# Patient Record
Sex: Male | Born: 1975 | Race: Black or African American | Hispanic: No | Marital: Single | Smoking: Current every day smoker
Health system: Southern US, Community
[De-identification: ages and names within clinical notes are randomized; demographics above are authoritative.]

---

## 2020-07-21 ENCOUNTER — Ambulatory Visit
Admission: EM | Admit: 2020-07-21 | Discharge: 2020-07-21 | Disposition: A | Discharge status: Home / Self Care | Payer: BC Managed Care – PPO | Attending: Internal Medicine | Admitting: Internal Medicine

## 2020-07-21 ENCOUNTER — Ambulatory Visit (INDEPENDENT_AMBULATORY_CARE_PROVIDER_SITE_OTHER): Payer: BC Managed Care – PPO

## 2020-07-21 DIAGNOSIS — M50322 Other cervical disc degeneration at C5-C6 level: Secondary | ICD-10-CM | POA: Diagnosis not present

## 2020-07-21 DIAGNOSIS — M79601 Pain in right arm: Secondary | ICD-10-CM

## 2020-07-21 DIAGNOSIS — B349 Viral infection, unspecified: Secondary | ICD-10-CM

## 2020-07-21 DIAGNOSIS — M542 Cervicalgia: Secondary | ICD-10-CM

## 2020-07-21 DIAGNOSIS — M25511 Pain in right shoulder: Secondary | ICD-10-CM

## 2020-07-21 DIAGNOSIS — Z1152 Encounter for screening for COVID-19: Secondary | ICD-10-CM

## 2020-07-21 MED ORDER — ONDANSETRON 4 MG PO TBDP: 4.0000 mg | ORAL_TABLET | Freq: Three times a day (TID) | ORAL | 0 refills | Status: AC | PRN

## 2020-07-21 MED ORDER — IBUPROFEN 600 MG PO TABS: 600.0000 mg | ORAL_TABLET | Freq: Four times a day (QID) | ORAL | 0 refills | Status: AC | PRN

## 2020-07-21 NOTE — ED Triage Notes (Signed)
Pt presents with c/o headache , body aches's and right shoulder pain since Sunday, pt also reports cold symptoms

## 2020-07-21 NOTE — Discharge Instructions (Signed)
Increase oral fluid intake Gentle range of motion exercises of the neck and right upper extremity If right upper extremity symptoms worsen you may benefit from further imaging of the neck. If you experience any weakness in the right arm, please go to the emergency room for imaging of the neck. Please quarantine until COVID-19 test results are available

## 2020-07-21 NOTE — ED Provider Notes (Signed)
[facility]    CSN: [id number] Arrival date & time: 07/21/20  1145      History   Chief Complaint Chief Complaint  Patient presents with  . Headache  . Shoulder Pain    HPI Gene Glazebrook is a 44 y.o. male comes to the urgent care with complaints of general malaise, nonproductive cough, nausea and vomiting as well as some diarrheal bowel movements over the past 4 to 5 days.  Patient says symptoms started insidiously and has been persistent.  He denies any fever or chills.  Appetite is diminished.  Patient also complains of right-sided arm pain with some right-sided neck pain.  No trauma to the neck.  He has some nasal congestion without any ear symptoms.  No sick contacts. HPI  History reviewed. No pertinent past medical history.  There are no problems to display for this patient.   History reviewed. No pertinent surgical history.     Home Medications    Prior to Admission medications   Medication Sig Start Date End Date Taking? Authorizing Provider  ibuprofen (ADVIL) 600 MG tablet Take 1 tablet (600 mg total) by mouth every 6 (six) hours as needed. 07/21/20   Michaeljoseph Revolorio, Britta Mccreedy, MD  ondansetron (ZOFRAN ODT) 4 MG disintegrating tablet Take 1 tablet (4 mg total) by mouth every 8 (eight) hours as needed for nausea or vomiting. 07/21/20   Rosibel Giacobbe, Britta Mccreedy, MD    Family History History reviewed. No pertinent family history.  Social History Social History   Tobacco Use  . Smoking status: Current Every Day Smoker    Packs/day: 0.50  . Smokeless tobacco: Never Used  Substance Use Topics  . Alcohol use: Not Currently  . Drug use: Not Currently     Allergies   Patient has no known allergies.   Review of Systems Review of Systems  HENT: Negative.   Respiratory: Negative.   Gastrointestinal: Negative.   Genitourinary: Negative.   Musculoskeletal: Positive for myalgias and neck pain. Negative for arthralgias and gait problem.  Skin: Negative.     Neurological: Negative for dizziness, light-headedness and numbness.     Physical Exam Triage Vital Signs ED Triage Vitals  Enc Vitals Group     BP 07/21/20 1302 (!) 134/93     Pulse Rate 07/21/20 1302 74     Resp 07/21/20 1302 20     Temp 07/21/20 1302 98.3 F (36.8 C)     Temp src --      SpO2 07/21/20 1302 95 %     Weight --      Height --      Head Circumference --      Peak Flow --      Pain Score 07/21/20 1258 6     Pain Loc --      Pain Edu? --      Excl. in GC? --    No data found.  Updated Vital Signs BP (!) 134/93   Pulse 74   Temp 98.3 F (36.8 C)   Resp 20   SpO2 95%   Visual Acuity Right Eye Distance:   Left Eye Distance:   Bilateral Distance:    Right Eye Near:   Left Eye Near:    Bilateral Near:     Physical Exam Vitals and nursing note reviewed.  Constitutional:      General: He is not in acute distress.    Appearance: He is not ill-appearing.  HENT:     Head: Normocephalic.  Mouth/Throat:     Mouth: Mucous membranes are moist.  Cardiovascular:     Rate and Rhythm: Normal rate and regular rhythm.  Pulmonary:     Effort: Pulmonary effort is normal.     Breath sounds: Normal breath sounds.  Abdominal:     General: Bowel sounds are normal.     Palpations: Abdomen is soft.  Musculoskeletal:     Cervical back: Normal range of motion. No rigidity.  Lymphadenopathy:     Cervical: No cervical adenopathy.  Neurological:     Mental Status: He is alert.     GCS: GCS eye subscore is 4. GCS verbal subscore is 5. GCS motor subscore is 6.     Cranial Nerves: No cranial nerve deficit or dysarthria.     Sensory: No sensory deficit.     Motor: No weakness.     Deep Tendon Reflexes: Reflexes abnormal.      UC Treatments / Results  Labs (all labs ordered are listed, but only abnormal results are displayed) Labs Reviewed  COVID-19, FLU A+B AND RSV  CBC  BASIC METABOLIC PANEL    EKG   Radiology No results  found.  Procedures Procedures (including critical care time)  Medications Ordered in UC Medications - No data to display  Initial Impression / Assessment and Plan / UC Course  I have reviewed the triage vital signs and the nursing notes.  Pertinent labs & imaging results that were available during my care of the patient were reviewed by me and considered in my medical decision making (see chart for details).     1.  Acute viral illness: Covid/flu/RSV PCR test sent CBC, BMP Ibuprofen as needed for pain and/or fever Return precautions given  2.  Neck pain with right arm pain: Cervical spine x-ray is significant for moderate multilevel degenerative disc disease with mild bilateral neural foraminal opening If neck symptoms persist patient will need to see a spine surgeon for further imaging and definitive management. Final Clinical Impressions(s) / UC Diagnoses   Final diagnoses:  Viral syndrome  Right arm pain     Discharge Instructions     Increase oral fluid intake Gentle range of motion exercises of the neck and right upper extremity If right upper extremity symptoms worsen you may benefit from further imaging of the neck. If you experience any weakness in the right arm, please go to the emergency room for imaging of the neck. Please quarantine until COVID-19 test results are available    ED Prescriptions    Medication Sig Dispense Auth. Provider   ondansetron (ZOFRAN ODT) 4 MG disintegrating tablet Take 1 tablet (4 mg total) by mouth every 8 (eight) hours as needed for nausea or vomiting. 20 tablet Marelin Tat, Britta Mccreedy, MD   ibuprofen (ADVIL) 600 MG tablet Take 1 tablet (600 mg total) by mouth every 6 (six) hours as needed. 30 tablet Hadassah Rana, Britta Mccreedy, MD     PDMP not reviewed this encounter.   Merrilee Jansky, MD 07/21/20 1432

## 2020-07-22 LAB — BASIC METABOLIC PANEL
BUN/Creatinine Ratio: 10 (ref 9–20)
BUN: 13 mg/dL (ref 6–24)
CO2: 23 mmol/L (ref 20–29)
Calcium: 9.4 mg/dL (ref 8.7–10.2)
Chloride: 102 mmol/L (ref 96–106)
Creatinine, Ser: 1.31 mg/dL — ABNORMAL HIGH (ref 0.76–1.27)
GFR calc Af Amer: 75 mL/min/{1.73_m2} (ref >59)
GFR calc non Af Amer: 65 mL/min/{1.73_m2} (ref >59)
Glucose: 96 mg/dL (ref 65–99)
Potassium: 4.2 mmol/L (ref 3.5–5.2)
Sodium: 141 mmol/L (ref 134–144)

## 2020-07-22 LAB — CBC
Hematocrit: 48.3 % (ref 37.5–51.0)
Hemoglobin: 16.4 g/dL (ref 13.0–17.7)
MCH: 30.5 pg (ref 26.6–33.0)
MCHC: 34 g/dL (ref 31.5–35.7)
MCV: 90 fL (ref 79–97)
Platelets: 255 10*3/uL (ref 150–450)
RBC: 5.37 x10E6/uL (ref 4.14–5.80)
RDW: 12.2 % (ref 11.6–15.4)
WBC: 7.6 10*3/uL (ref 3.4–10.8)

## 2020-07-23 LAB — COVID-19, FLU A+B AND RSV
Influenza A, NAA: NOT DETECTED
Influenza B, NAA: NOT DETECTED
RSV, NAA: NOT DETECTED
SARS-CoV-2, NAA: NOT DETECTED

## 2021-03-09 ENCOUNTER — Emergency Department (HOSPITAL_COMMUNITY): Payer: BC Managed Care – PPO

## 2021-03-09 ENCOUNTER — Ambulatory Visit (INDEPENDENT_AMBULATORY_CARE_PROVIDER_SITE_OTHER): Payer: BC Managed Care – PPO

## 2021-03-09 ENCOUNTER — Ambulatory Visit
Admission: EM | Admit: 2021-03-09 | Discharge: 2021-03-09 | Payer: BC Managed Care – PPO | Attending: Family Medicine | Admitting: Family Medicine

## 2021-03-09 ENCOUNTER — Emergency Department (HOSPITAL_COMMUNITY)
Admission: EM | Admit: 2021-03-09 | Discharge: 2021-03-09 | Disposition: A | Discharge status: Home / Self Care | Payer: BC Managed Care – PPO | Attending: Emergency Medicine | Admitting: Emergency Medicine

## 2021-03-09 ENCOUNTER — Other Ambulatory Visit: Payer: Self-pay

## 2021-03-09 ENCOUNTER — Encounter (HOSPITAL_COMMUNITY): Payer: Self-pay

## 2021-03-09 DIAGNOSIS — Z20822 Contact with and (suspected) exposure to covid-19: Secondary | ICD-10-CM | POA: Insufficient documentation

## 2021-03-09 DIAGNOSIS — R072 Precordial pain: Secondary | ICD-10-CM | POA: Diagnosis not present

## 2021-03-09 DIAGNOSIS — R202 Paresthesia of skin: Secondary | ICD-10-CM | POA: Diagnosis not present

## 2021-03-09 DIAGNOSIS — R079 Chest pain, unspecified: Secondary | ICD-10-CM

## 2021-03-09 DIAGNOSIS — R519 Headache, unspecified: Secondary | ICD-10-CM | POA: Insufficient documentation

## 2021-03-09 DIAGNOSIS — R0602 Shortness of breath: Secondary | ICD-10-CM

## 2021-03-09 DIAGNOSIS — R42 Dizziness and giddiness: Secondary | ICD-10-CM | POA: Insufficient documentation

## 2021-03-09 DIAGNOSIS — R197 Diarrhea, unspecified: Secondary | ICD-10-CM | POA: Insufficient documentation

## 2021-03-09 DIAGNOSIS — R1110 Vomiting, unspecified: Secondary | ICD-10-CM | POA: Insufficient documentation

## 2021-03-09 DIAGNOSIS — R030 Elevated blood-pressure reading, without diagnosis of hypertension: Secondary | ICD-10-CM

## 2021-03-09 DIAGNOSIS — Z28310 Unvaccinated for covid-19: Secondary | ICD-10-CM | POA: Insufficient documentation

## 2021-03-09 DIAGNOSIS — R61 Generalized hyperhidrosis: Secondary | ICD-10-CM | POA: Diagnosis not present

## 2021-03-09 DIAGNOSIS — R531 Weakness: Secondary | ICD-10-CM | POA: Diagnosis not present

## 2021-03-09 DIAGNOSIS — R0789 Other chest pain: Secondary | ICD-10-CM | POA: Diagnosis not present

## 2021-03-09 LAB — CBC WITH DIFFERENTIAL/PLATELET
Abs Immature Granulocytes: 0.02 10*3/uL (ref 0.00–0.07)
Basophils Absolute: 0 10*3/uL (ref 0.0–0.1)
Basophils Relative: 1 %
Eosinophils Absolute: 0.1 10*3/uL (ref 0.0–0.5)
Eosinophils Relative: 1 %
HCT: 46.5 % (ref 39.0–52.0)
Hemoglobin: 15.9 g/dL (ref 13.0–17.0)
Immature Granulocytes: 0 %
Lymphocytes Relative: 36 %
Lymphs Abs: 2.2 10*3/uL (ref 0.7–4.0)
MCH: 31.7 pg (ref 26.0–34.0)
MCHC: 34.2 g/dL (ref 30.0–36.0)
MCV: 92.6 fL (ref 80.0–100.0)
Monocytes Absolute: 0.6 10*3/uL (ref 0.1–1.0)
Monocytes Relative: 10 %
Neutro Abs: 3.3 10*3/uL (ref 1.7–7.7)
Neutrophils Relative %: 52 %
Platelets: 244 10*3/uL (ref 150–400)
RBC: 5.02 MIL/uL (ref 4.22–5.81)
RDW: 13 % (ref 11.5–15.5)
WBC: 6.2 10*3/uL (ref 4.0–10.5)
nRBC: 0 % (ref 0.0–0.2)

## 2021-03-09 LAB — COMPREHENSIVE METABOLIC PANEL
ALT: 74 U/L — ABNORMAL HIGH (ref 0–44)
AST: 47 U/L — ABNORMAL HIGH (ref 15–41)
Albumin: 3.8 g/dL (ref 3.5–5.0)
Alkaline Phosphatase: 82 U/L (ref 38–126)
Anion gap: 5 (ref 5–15)
BUN: 13 mg/dL (ref 6–20)
CO2: 25 mmol/L (ref 22–32)
Calcium: 8.6 mg/dL — ABNORMAL LOW (ref 8.9–10.3)
Chloride: 105 mmol/L (ref 98–111)
Creatinine, Ser: 1.17 mg/dL (ref 0.61–1.24)
GFR, Estimated: >60 mL/min (ref >60)
Glucose, Bld: 82 mg/dL (ref 70–99)
Potassium: 4.1 mmol/L (ref 3.5–5.1)
Sodium: 135 mmol/L (ref 135–145)
Total Bilirubin: 1 mg/dL (ref 0.3–1.2)
Total Protein: 7.2 g/dL (ref 6.5–8.1)

## 2021-03-09 LAB — D-DIMER, QUANTITATIVE: D-Dimer, Quant: <0.27 ug/mL-FEU (ref 0.00–0.50)

## 2021-03-09 LAB — TROPONIN I (HIGH SENSITIVITY)
Troponin I (High Sensitivity): 3 ng/L (ref <18)
Troponin I (High Sensitivity): 4 ng/L (ref <18)

## 2021-03-09 LAB — RESP PANEL BY RT-PCR (FLU A&B, COVID) ARPGX2
Influenza A by PCR: NEGATIVE
Influenza B by PCR: NEGATIVE
SARS Coronavirus 2 by RT PCR: NEGATIVE

## 2021-03-09 MED ORDER — NITROGLYCERIN 0.4 MG SL SUBL
0.4000 mg | SUBLINGUAL_TABLET | SUBLINGUAL | Status: DC | PRN
  Administered 2021-03-09: 0.4 mg via SUBLINGUAL

## 2021-03-09 MED ORDER — ASPIRIN 81 MG PO CHEW
324.0000 mg | CHEWABLE_TABLET | Freq: Once | ORAL | Status: AC
  Administered 2021-03-09: 324 mg via ORAL

## 2021-03-09 NOTE — ED Provider Notes (Signed)
[facility]   [id number] 03/09/21 Arrival Time: 1210  ASSESSMENT & PLAN:  1. Chest pain, unspecified type   2. SOB (shortness of breath)   3. Elevated blood pressure reading without diagnosis of hypertension    With lingering 2/10 pain here. Discussed my inability to r/o ACS here.  Meds ordered this encounter  Medications   nitroGLYCERIN (NITROSTAT) SL tablet 0.4 mg   aspirin chewable tablet 324 mg   Te ED via EMS for more extensive workup. Stable upon discharge.  ECG: Performed today and interpreted by me: sinus bradycardia, isolated T wave inversions in V1 and V3; no STEMI. No previous for comparison.  I have personally viewed the imaging studies ordered this visit. No pneumothorax.  Reviewed expectations re: course of current medical issues. Questions answered. Outlined signs and symptoms indicating need for more acute intervention. Patient verbalized understanding. After Visit Summary given.   SUBJECTIVE:  History from: patient. Mohamadou Maciver is a 45 y.o. male with no significant known medical history who presents with complaint of intermittent non-radiating L-sided chest pain; sharp in nature; may last few minutes "and linger" for a few hours at times. Noted similar over past several months but describes pain this morning as much more intense and with associated SOB/emesis/diaphoresis. Currently pain 3/10 without nausea. Very mild SOB. Drove himself here. Injury or recent strenuous activity: none. H/O GERD and questions relation. No abd pain reported Denies: fatigue, irregular heart beat, lower extremity edema, near-syncope, orthopnea, palpitations, paroxysmal nocturnal dyspnea, and syncope. Aggravating factors: have not been identified. Alleviating factors: have not been identified. Recent illnesses: none. Fever: absent. Ambulatory without assistance. No tx PTA. Denies recent street drug use.  Social History   Tobacco Use  Smoking Status Every Day    Packs/day: 0.50   Types: Cigarettes  Smokeless Tobacco Never   Social History   Substance and Sexual Activity  Alcohol Use Not Currently    OBJECTIVE:  Vitals:   03/09/21 1234  BP: (!) 175/117  Pulse: 70  Resp: 20  Temp: 98.9 F (37.2 C)  SpO2: 98%    General appearance: alert, oriented, no acute distress Eyes: PERRLA; EOMI; conjunctivae normal HENT: normocephalic; atraumatic Neck: supple with FROM Lungs: without labored respirations; speaks full sentences without difficulty; CTAB Heart: regular Chest Wall: without specific tenderness to palpation Abdomen: soft Extremities: without edema; without calf swelling or tenderness; symmetrical without gross deformities Skin: warm and dry; without rash or lesions Neuro: normal gait Psychological: alert and cooperative; normal mood and affect  Imaging: DG Chest 2 View  Result Date: 03/09/2021 CLINICAL DATA:  Chest pain.  Shortness of breath. EXAM: CHEST - 2 VIEW COMPARISON:  Prior chest x-ray report 09/21/2006. FINDINGS: Mediastinum and hilar structures normal. Very mild bilateral interstitial prominence noted. Mild pneumonitis cannot be completely excluded. No focal alveolar infiltrate. No pleural effusion or pneumothorax. No acute bony abnormality. IMPRESSION: Very mild bilateral interstitial prominence cannot be excluded. Mild pneumonitis cannot be completely excluded. No focal alveolar infiltrate. Electronically Signed   By: Maisie Fus  Register   On: 03/09/2021 13:12     No Known Allergies  History reviewed. No pertinent past medical history. Social History   Socioeconomic History   Marital status: Single    Spouse name: Not on file   Number of children: Not on file   Years of education: Not on file   Highest education level: Not on file  Occupational History   Not on file  Tobacco Use   Smoking status: Every Day  Packs/day: 0.50    Types: Cigarettes   Smokeless tobacco: Never  Substance and Sexual Activity    Alcohol use: Not Currently   Drug use: Not Currently   Sexual activity: Not on file  Other Topics Concern   Not on file  Social History Narrative   Not on file   Social Determinants of Health   Financial Resource Strain: Not on file  Food Insecurity: Not on file  Transportation Needs: Not on file  Physical Activity: Not on file  Stress: Not on file  Social Connections: Not on file  Intimate Partner Violence: Not on file   History reviewed. No pertinent family history. History reviewed. No pertinent surgical history.    Mardella Layman, MD 03/09/21 [phone]

## 2021-03-09 NOTE — ED Triage Notes (Signed)
Pt states he has been having headaches and chest pain for a few days, then while at work he developed a sharp chest pain and left hand numbness and dropped ink pen. Pts current pain is 3/10

## 2021-03-09 NOTE — ED Notes (Signed)
Patient is being discharged from the Urgent Care and sent to the Emergency Department via EMS . Per hagler, MD patient is in need of higher level of care due to Chest Pain, SOB, and elevated BP. Patient is aware and verbalizes understanding of plan of care.  Vitals:   03/09/21 1234  BP: (!) 175/117  Pulse: 70  Resp: 20  Temp: 98.9 F (37.2 C)  SpO2: 98%

## 2021-03-09 NOTE — ED Triage Notes (Signed)
Pt was at urgent care, 1 nitro, 4 ASA and IV established by EMS, pt alert and oriented, pt in not consistently in one place.

## 2021-03-09 NOTE — ED Provider Notes (Signed)
[facility] EMERGENCY DEPARTMENT Provider Note   CSN: [id number] Arrival date & time: 03/09/21  1402     History Chief Complaint  Patient presents with   Chest Pain    Bradley Hernandez is a 45 y.o. male.  Patient presenting with multiple complaints.  But was referred in from urgent care for chest pain.  Patient states he has been having some left-sided intermittent chest pain on and off for the past 6 months.  Did develop chest pain today while on his way to urgent care.  No chest pain now.  Chest pain is at times sharp and ache left upper anterior chest and left lower chest and also occasionally in the right lower anterior chest.  But also and Monday he started with a pretty significant headache and diarrhea and then today he had vomiting no appetite and diaphoresis with the vomiting.  Felt dizzy this morning when he got up.  Patient states he has been having headaches on and off for about a year.  Not severe but not known to have a history of headaches.  Family history is negative for any significant coronary artery disease prematurely.  He is a smoker.  Patient not followed by primary care.  Has not had any of the COVID vaccines.   In addition patient's had some intermittent numbness and some weakness to his left hand.  Had a pretty significant episode of this on Friday.  None of that is present currently.  Patient right now is really without any symptoms.  Patient did have chest x-ray done at urgent care.  Able to see the results of that.  Was consistent with perhaps a pneumonitis.  Do not need to repeat chest x-ray here for now.      History reviewed. No pertinent past medical history.  There are no problems to display for this patient.   History reviewed. No pertinent surgical history.     History reviewed. No pertinent family history.  Social History   Tobacco Use   Smoking status: Every Day    Packs/day: 0.50    Types: Cigarettes   Smokeless tobacco: Never   Substance Use Topics   Alcohol use: Not Currently   Drug use: Not Currently    Home Medications Prior to Admission medications   Medication Sig Start Date End Date Taking? Authorizing Provider  ibuprofen (ADVIL) 600 MG tablet Take 1 tablet (600 mg total) by mouth every 6 (six) hours as needed. 07/21/20   Lamptey, Britta Mccreedy, MD  ondansetron (ZOFRAN ODT) 4 MG disintegrating tablet Take 1 tablet (4 mg total) by mouth every 8 (eight) hours as needed for nausea or vomiting. 07/21/20   Lamptey, Britta Mccreedy, MD    Allergies    Patient has no known allergies.  Review of Systems   Review of Systems  Constitutional:  Positive for diaphoresis.  HENT:  Negative for congestion.   Respiratory:  Negative for cough and shortness of breath.   Cardiovascular:  Positive for chest pain.  Gastrointestinal:  Positive for diarrhea, nausea and vomiting. Negative for anal bleeding.  Musculoskeletal:  Negative for myalgias.  Neurological:  Positive for dizziness, weakness, numbness and headaches. Negative for syncope, facial asymmetry and speech difficulty.   Physical Exam Updated Vital Signs BP (!) 135/96   Pulse 68   Temp 98.9 F (37.2 C) (Oral)   Resp 13   Ht 1.803 m (5\' 11" )   Wt 108.9 kg   SpO2 99%   BMI 33.47 kg/m  Physical Exam Vitals and nursing note reviewed.  Constitutional:      General: He is not in acute distress.    Appearance: Normal appearance. He is well-developed. He is not ill-appearing.  HENT:     Head: Normocephalic and atraumatic.  Eyes:     Extraocular Movements: Extraocular movements intact.     Conjunctiva/sclera: Conjunctivae normal.     Pupils: Pupils are equal, round, and reactive to light.  Cardiovascular:     Rate and Rhythm: Normal rate and regular rhythm.     Heart sounds: No murmur heard. Pulmonary:     Effort: Pulmonary effort is normal. No respiratory distress.     Breath sounds: Normal breath sounds.  Chest:     Chest wall: No tenderness.  Abdominal:      Palpations: Abdomen is soft.     Tenderness: There is no abdominal tenderness.  Musculoskeletal:        General: No swelling.     Cervical back: Neck supple.  Skin:    General: Skin is warm and dry.     Capillary Refill: Capillary refill takes less than 2 seconds.  Neurological:     General: No focal deficit present.     Mental Status: He is alert and oriented to person, place, and time.     Cranial Nerves: No cranial nerve deficit.     Sensory: No sensory deficit.     Motor: No weakness.    ED Results / Procedures / Treatments   Labs (all labs ordered are listed, but only abnormal results are displayed) Labs Reviewed - No data to display  EKG None  Radiology DG Chest 2 View  Result Date: 03/09/2021 CLINICAL DATA:  Chest pain.  Shortness of breath. EXAM: CHEST - 2 VIEW COMPARISON:  Prior chest x-ray report 09/21/2006. FINDINGS: Mediastinum and hilar structures normal. Very mild bilateral interstitial prominence noted. Mild pneumonitis cannot be completely excluded. No focal alveolar infiltrate. No pleural effusion or pneumothorax. No acute bony abnormality. IMPRESSION: Very mild bilateral interstitial prominence cannot be excluded. Mild pneumonitis cannot be completely excluded. No focal alveolar infiltrate. Electronically Signed   By: Maisie Fus  Register   On: 03/09/2021 13:12    Procedures Procedures   Medications Ordered in ED Medications - No data to display  ED Course  I have reviewed the triage vital signs and the nursing notes.  Pertinent labs & imaging results that were available during my care of the patient were reviewed by me and considered in my medical decision making (see chart for details).    MDM Rules/Calculators/A&P                           Based on patient's multiple symptoms we will do a broad work-up.  If he rules out with troponins from a cardiac standpoint we will definitely have him follow-up with cardiology.  But his other symptoms could be viral  in nature.  Perhaps even COVID.  We will screen for that.  Extensive work-up without any acute findings.  Most significant is that COVID testing flu testing was negative troponins were normal x2.  Will have patient follow-up with cardiology.  CT head without any acute findings.  Patient had chest x-ray earlier today that showed may be a pneumonitis.  But nothing specific.  Patient will start a baby aspirin a day and follow-up with cardiology.   Final Clinical Impression(s) / ED Diagnoses Final diagnoses:  None    Rx /  DC Orders ED Discharge Orders     None        Vanetta Mulders, MD 03/09/21 2028

## 2021-03-09 NOTE — Discharge Instructions (Addendum)
Can appointment to follow-up with cardiology.  Today's work-up for the chest pain without any acute findings.  Also additional work-up to include CT head and broad labs without evidence of any significant findings.  Return for any new or worse chest pain.  Start a baby aspirin a day.

## 2021-03-10 ENCOUNTER — Ambulatory Visit (INDEPENDENT_AMBULATORY_CARE_PROVIDER_SITE_OTHER): Payer: BC Managed Care – PPO | Admitting: Cardiovascular Disease

## 2021-03-10 ENCOUNTER — Other Ambulatory Visit: Payer: Self-pay

## 2021-03-10 ENCOUNTER — Encounter: Payer: Self-pay | Admitting: Cardiovascular Disease

## 2021-03-10 VITALS — BP 134/88 | HR 90 | Ht 71.0 in | Wt 237.0 lb

## 2021-03-10 DIAGNOSIS — R071 Chest pain on breathing: Secondary | ICD-10-CM

## 2021-03-10 DIAGNOSIS — R9389 Abnormal findings on diagnostic imaging of other specified body structures: Secondary | ICD-10-CM | POA: Diagnosis not present

## 2021-03-10 DIAGNOSIS — J189 Pneumonia, unspecified organism: Secondary | ICD-10-CM | POA: Diagnosis not present

## 2021-03-10 DIAGNOSIS — R0602 Shortness of breath: Secondary | ICD-10-CM

## 2021-03-10 NOTE — Patient Instructions (Signed)
Medication Instructions:  Your physician recommends that you continue on your current medications as directed. Please refer to the Current Medication list given to you today.  *If you need a refill on your cardiac medications before your next appointment, please call your pharmacy*   Lab Work: NONE   If you have labs (blood work) drawn today and your tests are completely normal, you will receive your results only by: MyChart Message (if you have MyChart) OR A paper copy in the mail If you have any lab test that is abnormal or we need to change your treatment, we will call you to review the results.   Testing/Procedures: Your physician has requested that you have an echocardiogram. Echocardiography is a painless test that uses sound waves to create images of your heart. It provides your doctor with information about the size and shape of your heart and how well your heart's chambers and valves are working. This procedure takes approximately one hour. There are no restrictions for this procedure.  Chest CT    Follow-Up: At [facility], you and your health needs are our priority.  As part of our continuing mission to provide you with exceptional heart care, we have created designated Provider Care Teams.  These Care Teams include your primary Cardiologist (physician) and Advanced Practice Providers (APPs -  Physician Assistants and Nurse Practitioners) who all work together to provide you with the care you need, when you need it.  We recommend signing up for the patient portal called "MyChart".  Sign up information is provided on this After Visit Summary.  MyChart is used to connect with patients for Virtual Visits (Telemedicine).  Patients are able to view lab/test results, encounter notes, upcoming appointments, etc.  Non-urgent messages can be sent to your provider as well.   To learn more about what you can do with MyChart, go to [email or web address].    Your next appointment:     As needed   The format for your next appointment:   In Person  Provider:   Charlton Haws, MD   Other Instructions Thank you for choosing [facility]!

## 2021-03-10 NOTE — Progress Notes (Signed)
CARDIOLOGY CONSULT NOTE       Patient ID: Bradley Hernandez MRN: [id number] DOB/AGE: 46/46/1977 46 y.o.  Admit date: (Not on file) Referring Physician: [facility] Primary Physician: Patient, No Pcp Per (Inactive) Primary Cardiologist: new Reason for Consultation: Chest pain   Active Problems:   * No active hospital problems. *   HPI:  45 y.o. referred by Dr [facility] for chest pain Seen there 03/09/21 Intermittent pain 6 months Sharp And ache left upper anterior chest sometimes right sided. GI symptoms diarrhea, vomiting and headache Some Dizziness when getting up Smoker No COVID vaccines CXR at urgent care ? Pneumonitis Respiratory panel negative Troponin negative CT head negative   CXR:  IMPRESSION: Very mild bilateral interstitial prominence cannot be excluded. Mild pneumonitis cannot be completely excluded. No focal alveolar infiltrate.  ROS All other systems reviewed and negative except as noted above  History reviewed. No pertinent past medical history.  Family History  Problem Relation Age of Onset   Hypertension Mother    Diabetes Mother     Social History   Socioeconomic History   Marital status: Single    Spouse name: Not on file   Number of children: Not on file   Years of education: Not on file   Highest education level: Not on file  Occupational History   Not on file  Tobacco Use   Smoking status: Every Day    Packs/day: 0.50    Types: Cigarettes   Smokeless tobacco: Never  Vaping Use   Vaping Use: Never used  Substance and Sexual Activity   Alcohol use: Not Currently    Alcohol/week: 14.0 standard drinks    Types: 4 Cans of beer, 10 Shots of liquor per week   Drug use: Yes    Types: Marijuana   Sexual activity: Not on file  Other Topics Concern   Not on file  Social History Narrative   Not on file   Social Determinants of Health   Financial Resource Strain: Not on file  Food Insecurity: Not on file  Transportation Needs: Not on  file  Physical Activity: Not on file  Stress: Not on file  Social Connections: Not on file  Intimate Partner Violence: Not on file    History reviewed. No pertinent surgical history.    Current Outpatient Medications:    ibuprofen (ADVIL) 600 MG tablet, Take 1 tablet (600 mg total) by mouth every 6 (six) hours as needed., Disp: 30 tablet, Rfl: 0   ondansetron (ZOFRAN ODT) 4 MG disintegrating tablet, Take 1 tablet (4 mg total) by mouth every 8 (eight) hours as needed for nausea or vomiting., Disp: 20 tablet, Rfl: 0    Physical Exam: Blood pressure 134/88, pulse 90, height 5\' 11"  (1.803 m), weight 107.5 kg, SpO2 99 %.    Affect appropriate Healthy:  appears stated age HEENT: normal Neck supple with no adenopathy JVP normal no bruits no thyromegaly Lungs clear with no wheezing and good diaphragmatic motion Heart:  S1/S2 no murmur, no rub, gallop or click PMI normal Abdomen: benighn, BS positve, no tenderness, no AAA no bruit.  No HSM or HJR Distal pulses intact with no bruits No edema Neuro non-focal Skin warm and dry No muscular weakness   Labs:   Lab Results  Component Value Date   WBC 6.2 03/09/2021   HGB 15.9 03/09/2021   HCT 46.5 03/09/2021   MCV 92.6 03/09/2021   PLT 244 03/09/2021    Recent Labs  Lab 03/09/21  1526  NA 135  K 4.1  CL 105  CO2 25  BUN 13  CREATININE 1.17  CALCIUM 8.6*  PROT 7.2  BILITOT 1.0  ALKPHOS 82  ALT 74*  AST 47*  GLUCOSE 82   No results found for: CKTOTAL, CKMB, CKMBINDEX, TROPONINI No results found for: CHOL No results found for: HDL No results found for: LDLCALC No results found for: TRIG No results found for: CHOLHDL No results found for: LDLDIRECT    Radiology: DG Chest 2 View  Result Date: 03/09/2021 CLINICAL DATA:  Chest pain.  Shortness of breath. EXAM: CHEST - 2 VIEW COMPARISON:  Prior chest x-ray report 09/21/2006. FINDINGS: Mediastinum and hilar structures normal. Very mild bilateral interstitial prominence  noted. Mild pneumonitis cannot be completely excluded. No focal alveolar infiltrate. No pleural effusion or pneumothorax. No acute bony abnormality. IMPRESSION: Very mild bilateral interstitial prominence cannot be excluded. Mild pneumonitis cannot be completely excluded. No focal alveolar infiltrate. Electronically Signed   By: Maisie Fus  Register   On: 03/09/2021 13:12   CT Head Wo Contrast  Result Date: 03/09/2021 CLINICAL DATA:  Headache EXAM: CT HEAD WITHOUT CONTRAST TECHNIQUE: Contiguous axial images were obtained from the base of the skull through the vertex without intravenous contrast. COMPARISON:  None. FINDINGS: Brain: No evidence of acute infarction, hemorrhage, hydrocephalus, extra-axial collection or mass lesion/mass effect. Vascular: No hyperdense vessel or unexpected calcification. Skull: Normal. Negative for fracture or focal lesion. Sinuses/Orbits: No acute finding. Other: None. IMPRESSION: No acute intracranial abnormality. Electronically Signed   By: Allegra Lai MD   On: 03/09/2021 17:02    EKG: SR rate 59 nonspecific ST changes 03/09/21    ASSESSMENT AND PLAN:   Chest Tightness: non cardiac sounding ? Pneumonitis on CXR Hi res CT to f/u on abnormal cxr and assess coronary calcium  Echo to r/o structural heart disease Smoking: counseled on cessation < 10 minutes CT as above Headache:  CT negative needs to see eye doctor and have 45 yo prescription checked as well as r/o glaucoma   Hi Res CT chest Echo   F/U PRN   Signed: Charlton Haws 03/10/2021, 1:19 PM

## 2021-04-07 ENCOUNTER — Other Ambulatory Visit: Payer: Self-pay

## 2021-04-07 ENCOUNTER — Ambulatory Visit (HOSPITAL_COMMUNITY)
Admission: RE | Admit: 2021-04-07 | Discharge: 2021-04-07 | Disposition: A | Discharge status: Home / Self Care | Payer: BC Managed Care – PPO | Source: Ambulatory Visit | Attending: Cardiovascular Disease | Admitting: Cardiovascular Disease

## 2021-04-07 ENCOUNTER — Ambulatory Visit (HOSPITAL_BASED_OUTPATIENT_CLINIC_OR_DEPARTMENT_OTHER)
Admission: RE | Admit: 2021-04-07 | Discharge: 2021-04-07 | Disposition: A | Discharge status: Home / Self Care | Payer: BC Managed Care – PPO | Source: Ambulatory Visit | Attending: Cardiovascular Disease | Admitting: Cardiovascular Disease

## 2021-04-07 DIAGNOSIS — R079 Chest pain, unspecified: Secondary | ICD-10-CM | POA: Diagnosis not present

## 2021-04-07 DIAGNOSIS — J189 Pneumonia, unspecified organism: Secondary | ICD-10-CM | POA: Insufficient documentation

## 2021-04-07 DIAGNOSIS — R0602 Shortness of breath: Secondary | ICD-10-CM

## 2021-04-07 DIAGNOSIS — I700 Atherosclerosis of aorta: Secondary | ICD-10-CM | POA: Diagnosis not present

## 2021-04-07 DIAGNOSIS — R9389 Abnormal findings on diagnostic imaging of other specified body structures: Secondary | ICD-10-CM | POA: Insufficient documentation

## 2021-04-07 DIAGNOSIS — R911 Solitary pulmonary nodule: Secondary | ICD-10-CM | POA: Diagnosis not present

## 2021-04-07 DIAGNOSIS — R918 Other nonspecific abnormal finding of lung field: Secondary | ICD-10-CM | POA: Diagnosis not present

## 2021-04-07 LAB — ECHOCARDIOGRAM COMPLETE
Area-P 1/2: 4.71 cm2
S' Lateral: 3.7 cm

## 2021-04-07 NOTE — Progress Notes (Signed)
*  PRELIMINARY RESULTS* Echocardiogram 2D Echocardiogram has been performed.  Stacey Drain 04/07/2021, 10:01 AM

## 2021-04-28 ENCOUNTER — Other Ambulatory Visit: Payer: Self-pay

## 2021-04-28 ENCOUNTER — Ambulatory Visit
Admission: EM | Admit: 2021-04-28 | Discharge: 2021-04-28 | Disposition: A | Discharge status: Home / Self Care | Payer: BC Managed Care – PPO | Attending: Family Medicine | Admitting: Family Medicine

## 2021-04-28 DIAGNOSIS — Z1152 Encounter for screening for COVID-19: Secondary | ICD-10-CM | POA: Diagnosis not present

## 2021-04-28 NOTE — ED Triage Notes (Signed)
Pt had positive home test this morning after exposure, work  requires PCR, pt feels ok

## 2021-04-29 LAB — SARS-COV-2, NAA 2 DAY TAT

## 2021-04-29 LAB — NOVEL CORONAVIRUS, NAA: SARS-CoV-2, NAA: DETECTED — AB

## 2022-01-22 IMAGING — DX DG CHEST 2V
2 series · 2 of 2 positions shown · non-contrast
Comparison: Prior chest x-ray report 09/21/2006.

CLINICAL DATA: Chest pain.  Shortness of breath.

EXAM:
CHEST - 2 VIEW

[chest pa]
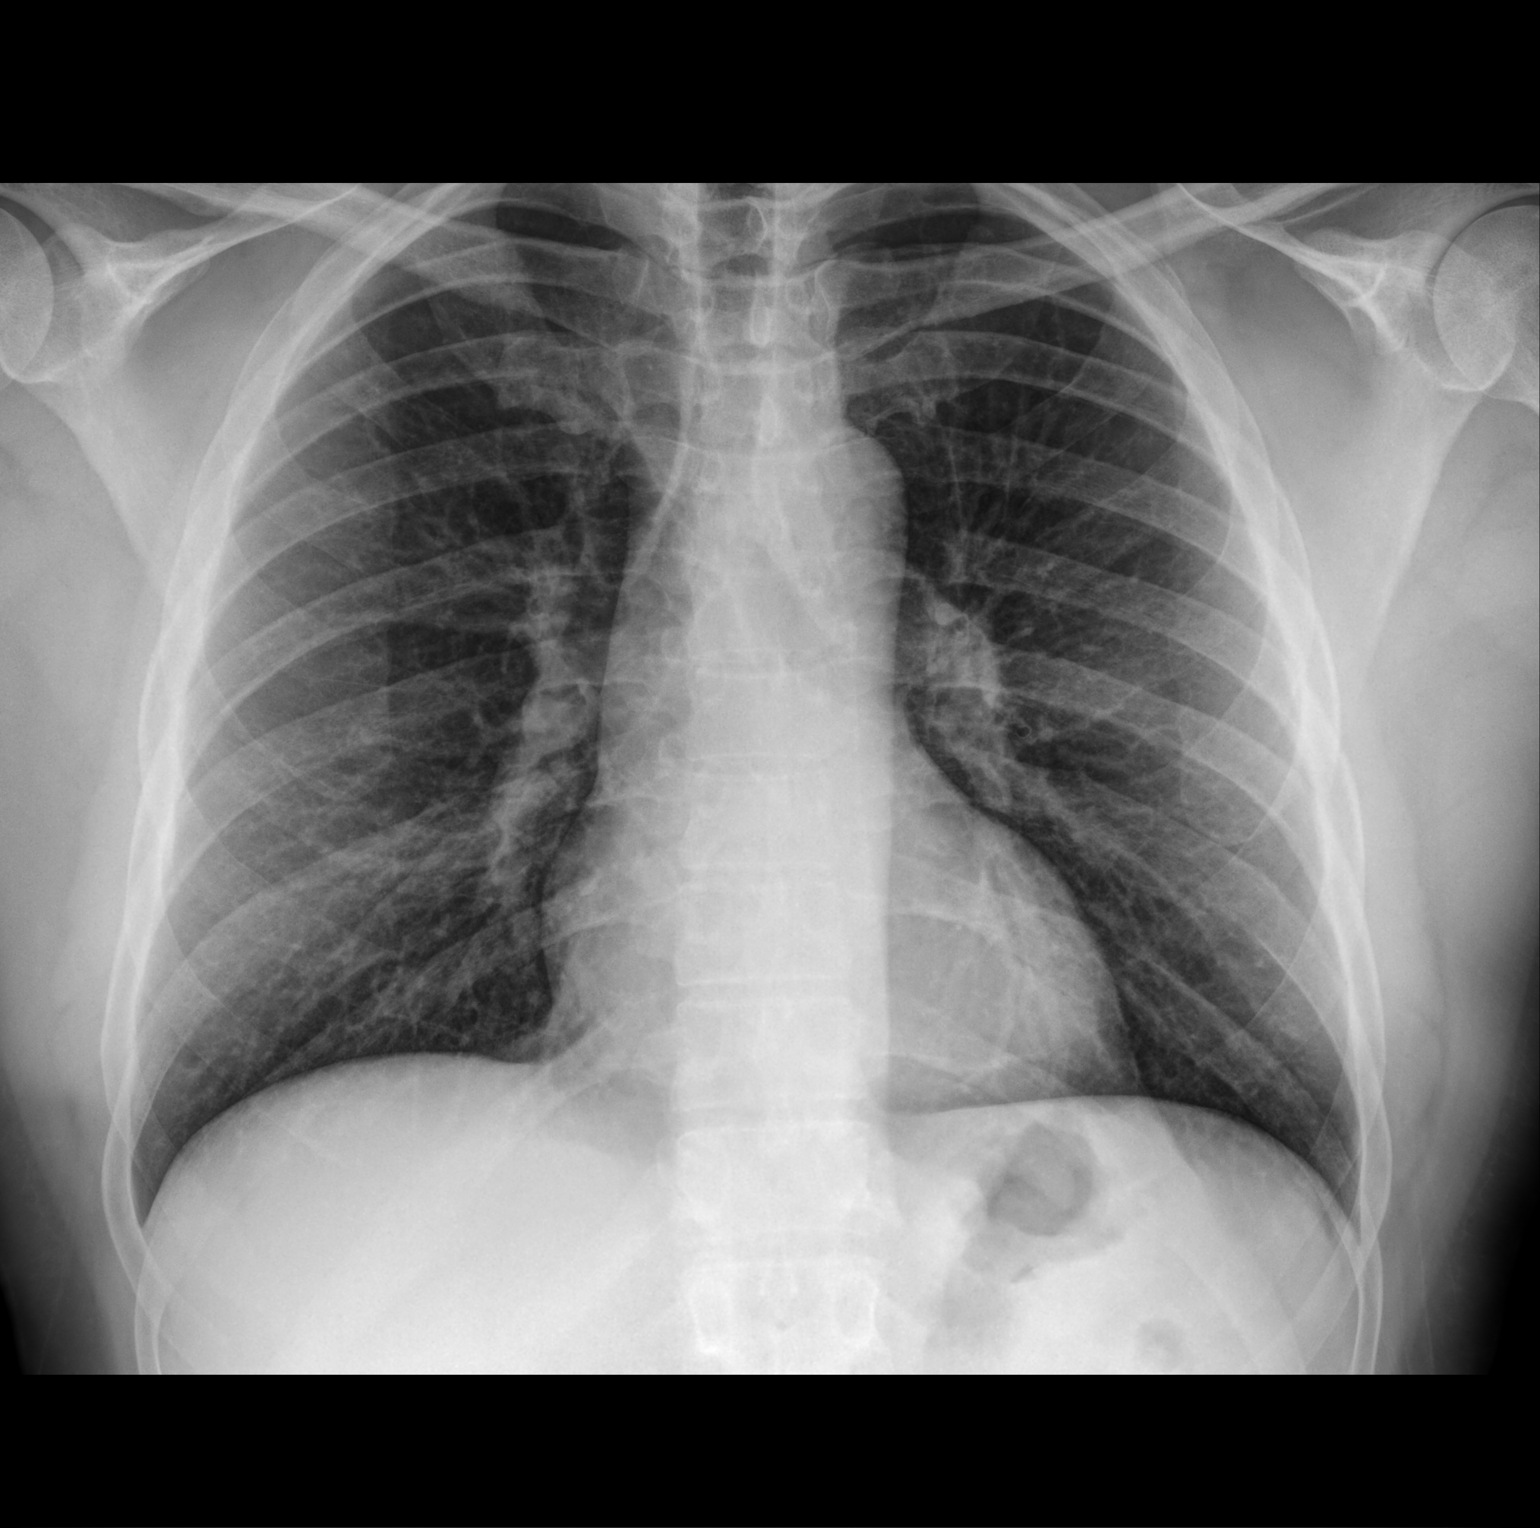

[chest lat]
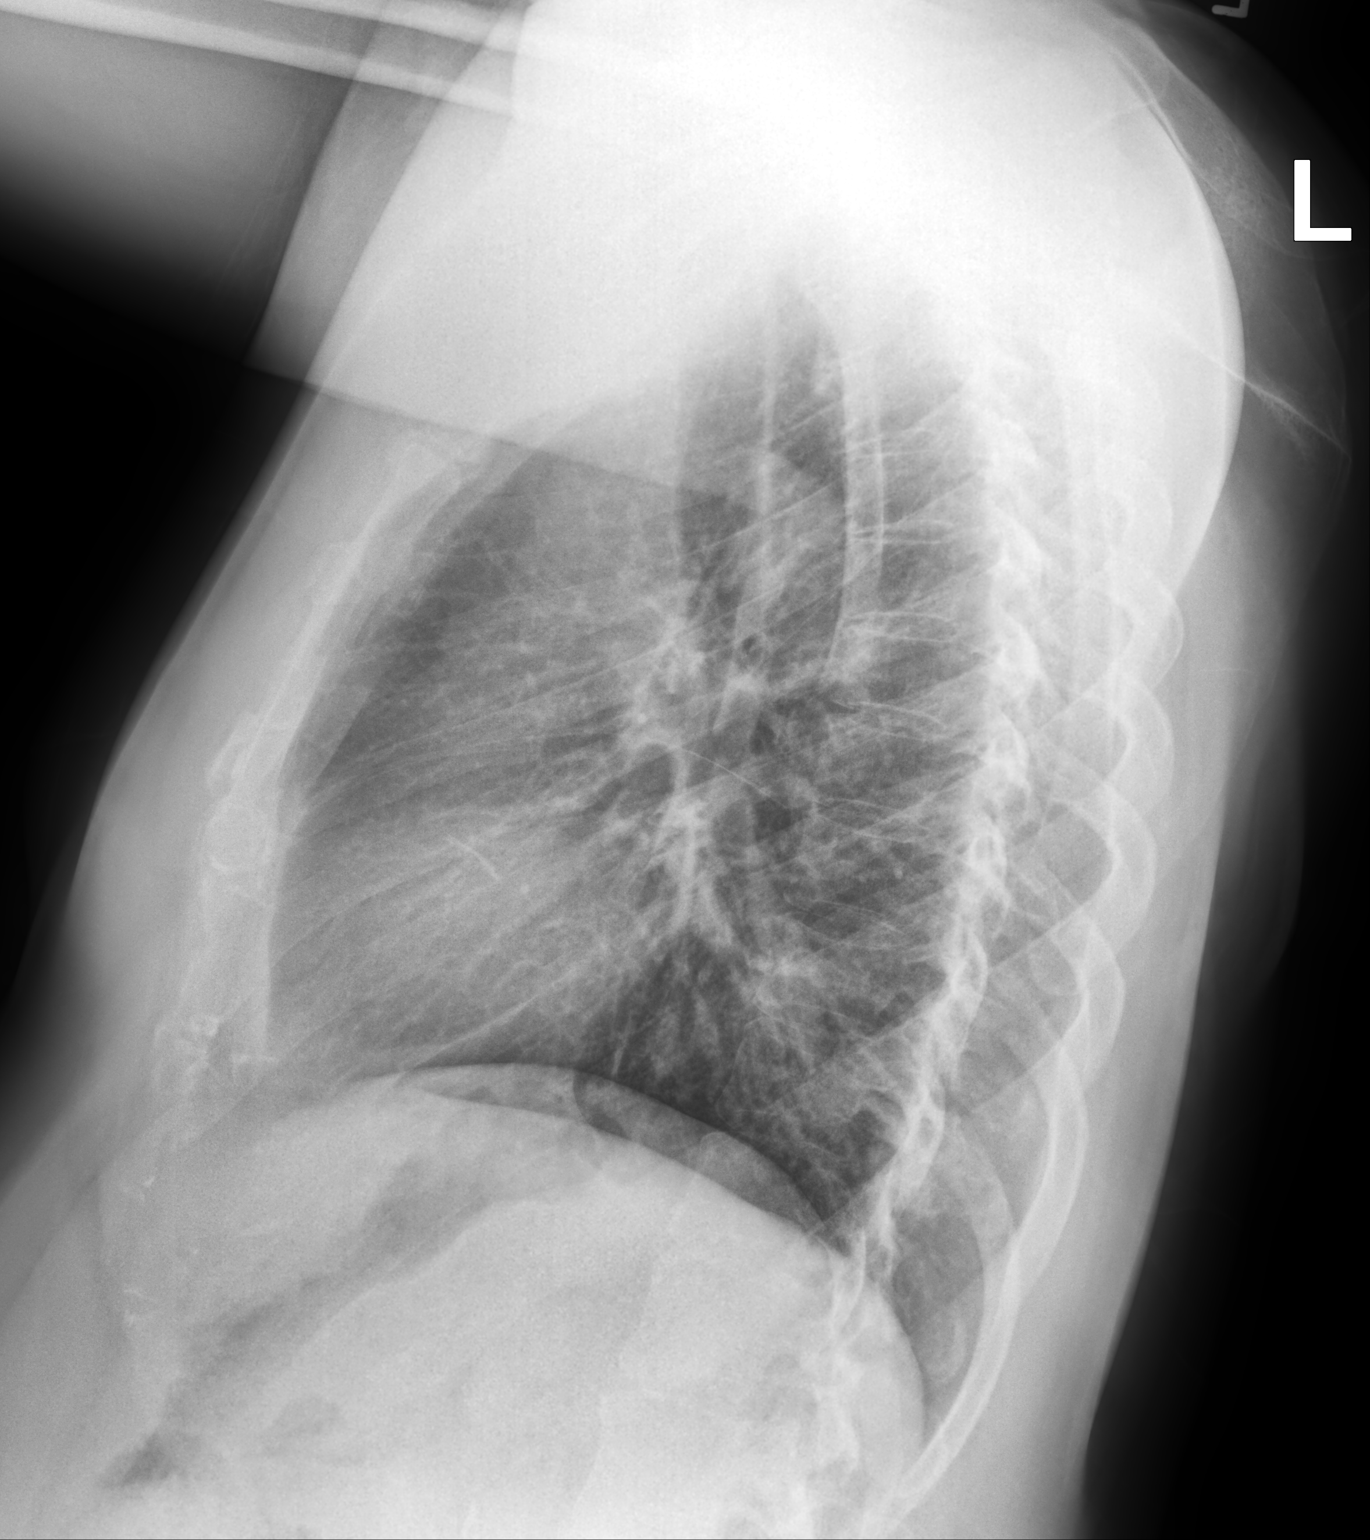

[2 of 2 positions shown; findings below may reference images not displayed]

FINDINGS: Mediastinum and hilar structures normal. Very mild bilateral
interstitial prominence noted. Mild pneumonitis cannot be completely
excluded. No focal alveolar infiltrate. No pleural effusion or
pneumothorax. No acute bony abnormality.
IMPRESSION: Very mild bilateral interstitial prominence cannot be excluded. Mild
pneumonitis cannot be completely excluded. No focal alveolar
infiltrate.

## 2022-02-20 IMAGING — CT CT CHEST HIGH RESOLUTION W/O CM
3 of 5 series · 14 of 36 positions shown, 15 images · non-contrast
Comparison: 03/09/2021 chest radiograph.

CLINICAL DATA: Chest pain. Abnormal chest radiograph with
interstitial prominence. Pneumonitis. Less than 20 pack-year smoking
history. Lung cancer screening, < 20 pk-yr smoking history, no risk
factors abnormal CXR

EXAM:
CT CHEST WITHOUT CONTRAST
TECHNIQUE: Multidetector CT imaging of the chest was performed following the
standard protocol without intravenous contrast. High resolution
imaging of the lungs, as well as inspiratory and expiratory imaging,
was performed.

[Series 3: standard chest · axial · 0.63mm/px · z∈[+1164,+1386]mm · 8 of 137 slices shown]
[im 13/137  mediastinal]
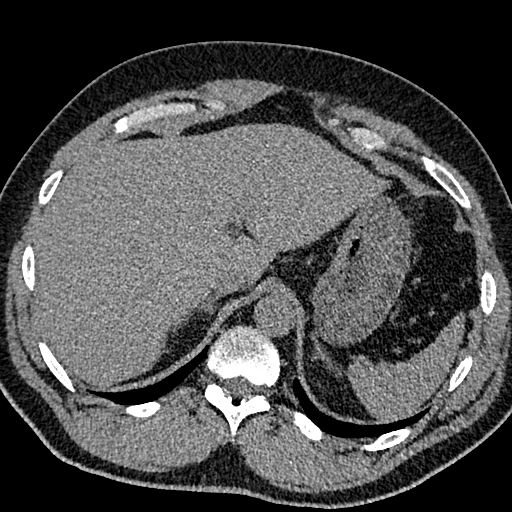
[im 26/137  mediastinal]
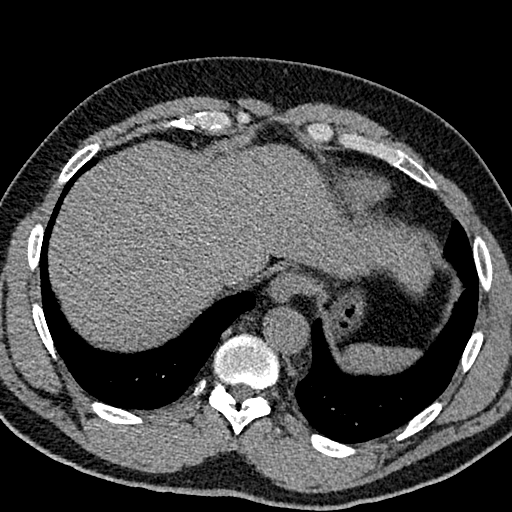
[im 46/137  mediastinal]
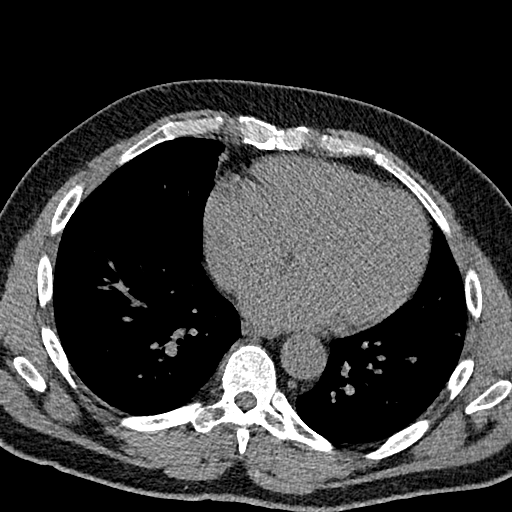
[im 59/137  mediastinal]
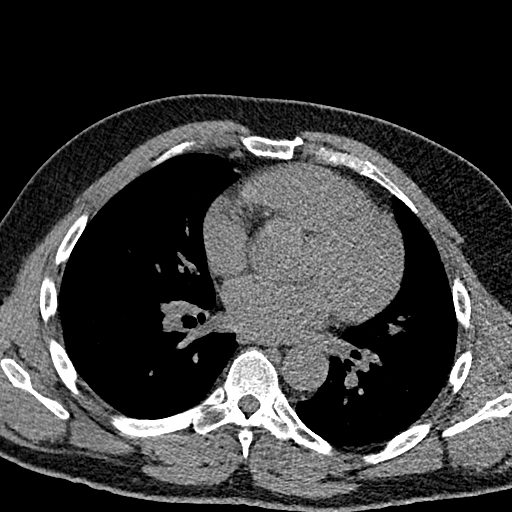
[im 78/137  mediastinal]
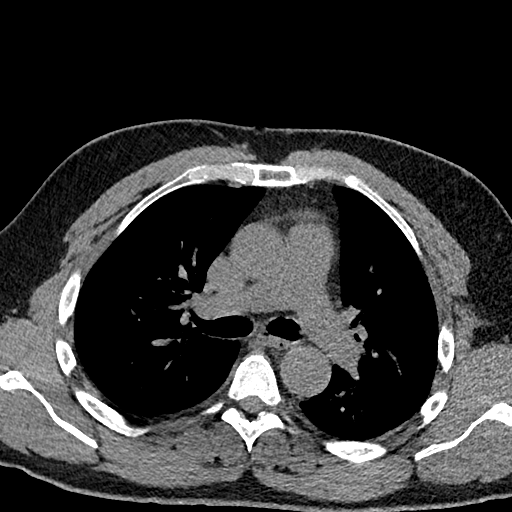
[im 91/137  mediastinal]
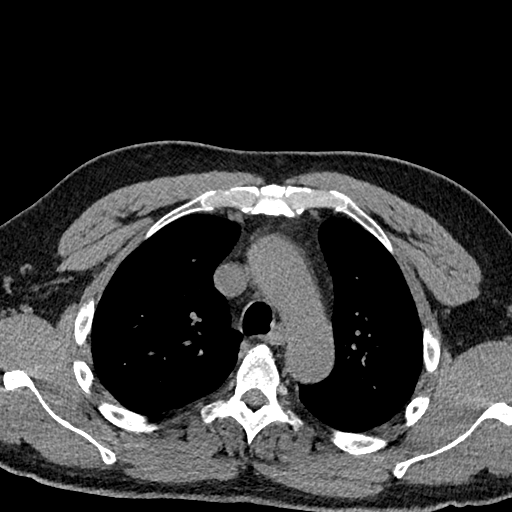
[im 111/137  mediastinal]
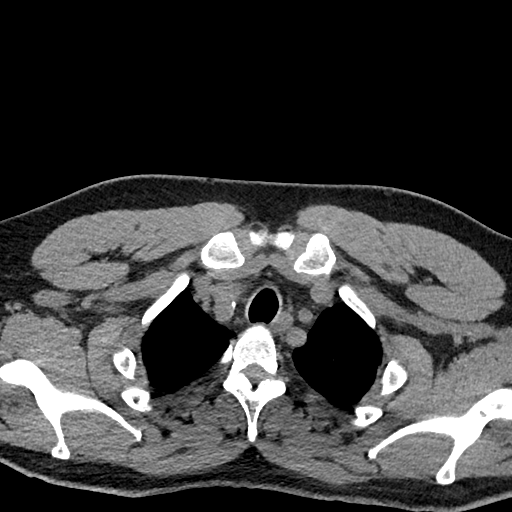
[im 124/137  mediastinal]
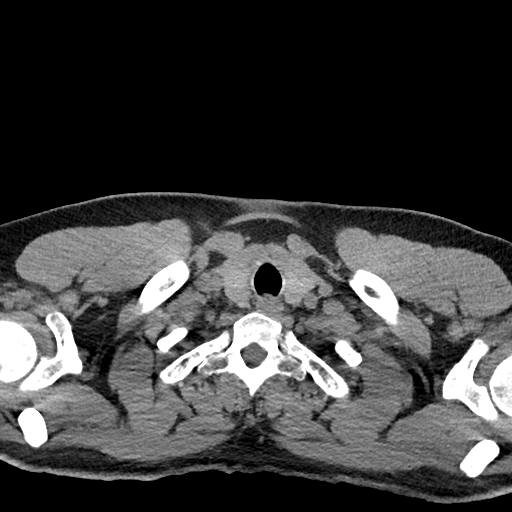

[Series 4: high res insp · axial · 0.63mm/px · z∈[+1145,+1416]mm · 3 of 20 slices shown, 4 images]
[im 1/20  mediastinal]
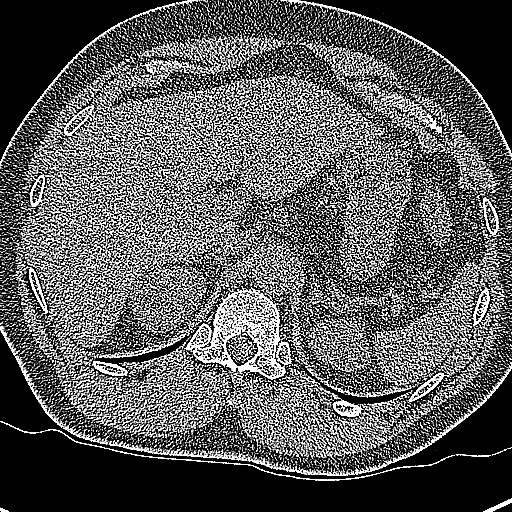
[im 1/20  lung]
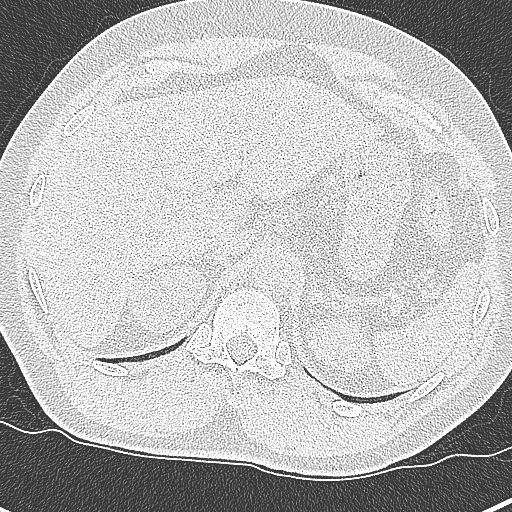
[im 10/20  lung]
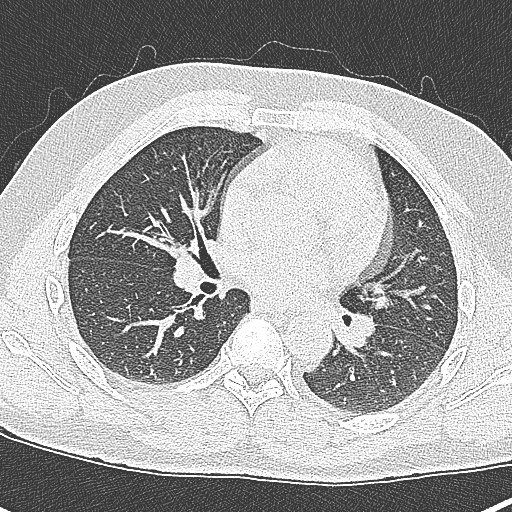
[im 20/20  lung]
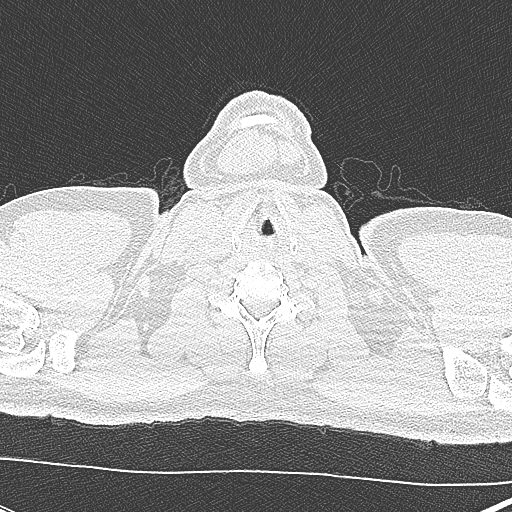

[Series 9: coronal · coronal · 0.57mm/px · 3 of 151 slices shown]
[im 31/151  lung]
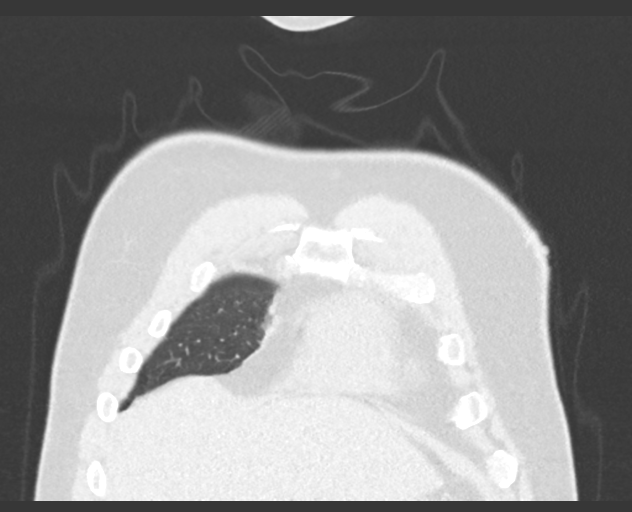
[im 61/151  lung]
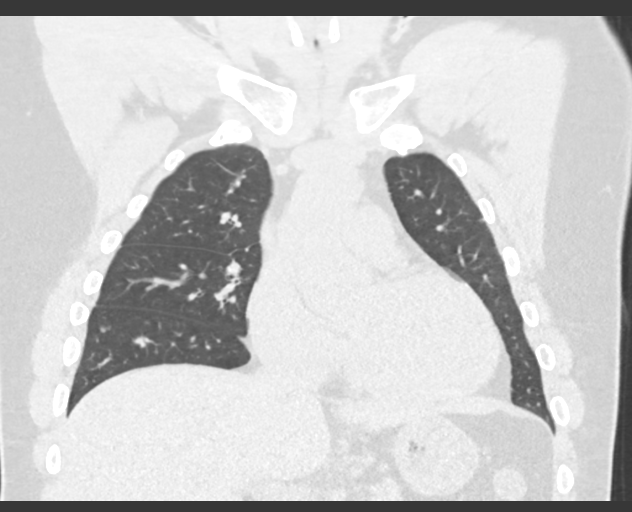
[im 91/151  lung]
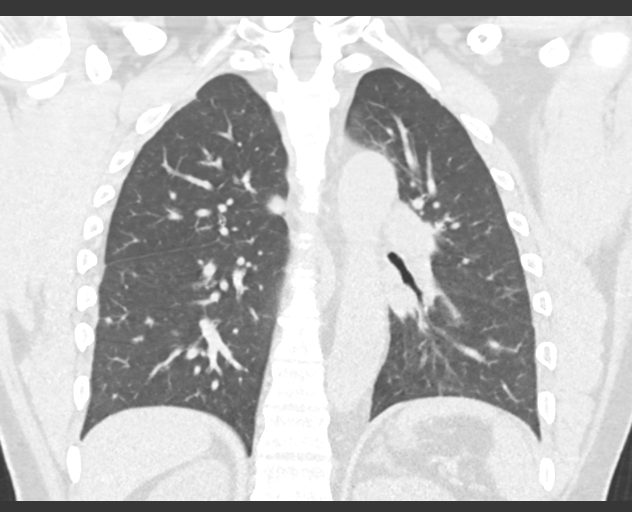

[14 of 36 positions shown; findings below may reference images not displayed]

FINDINGS: Cardiovascular: Top-normal heart size. No significant pericardial
effusion/thickening. Left anterior descending coronary
atherosclerosis. Minimally atherosclerotic nonaneurysmal thoracic
aorta. Normal caliber pulmonary arteries.

Mediastinum/Nodes: No discrete thyroid nodules. Unremarkable
esophagus. No pathologically enlarged axillary, mediastinal or hilar
lymph nodes, noting limited sensitivity for the detection of hilar
adenopathy on this noncontrast study.

Lungs/Pleura: No pneumothorax. No pleural effusion. No acute
consolidative airspace disease or lung masses. Numerous (at least
10) solid pulmonary nodules scattered throughout both lungs, largest
5 mm in the posterior right lower lobe (series 7/image 102) and 5 mm
in the posterior left lower lobe (series 7/image 105). No
significant lobular air trapping or evidence of
tracheobronchomalacia on the expiration sequence. No significant
regions of subpleural reticulation, ground-glass attenuation,
traction bronchiectasis, architectural distortion or frank
honeycombing.

Upper abdomen: No acute abnormality.

Musculoskeletal: No aggressive appearing focal osseous lesions. Mild
thoracic spondylosis.
IMPRESSION: 1. No evidence of interstitial lung disease.
2. Numerous (at least 10) small solid pulmonary nodules scattered
throughout both lungs, largest 5 mm in the posterior right lower
lobe. No follow-up needed if patient is low-risk (and has no known
or suspected primary neoplasm). Non-contrast chest CT can be
considered in 12 months if patient is high-risk. This recommendation
follows the consensus statement: Guidelines for Management of
Incidental Pulmonary Nodules Detected on CT Images: From the
3. One vessel coronary atherosclerosis.
4. Aortic Atherosclerosis ([id number].H).
